# Patient Record
Sex: Female | Born: 1965 | Race: Black or African American | Hispanic: No | Marital: Single | Smoking: Never smoker
Health system: Southern US, Community
[De-identification: ages and names within clinical notes are randomized; demographics above are authoritative.]

## PROBLEM LIST (undated history)

## (undated) HISTORY — PX: ABDOMINAL HYSTERECTOMY: SHX81

---

## 2002-11-29 ENCOUNTER — Other Ambulatory Visit: Admission: RE | Admit: 2002-11-29 | Discharge: 2002-11-29 | Payer: Self-pay | Admitting: Obstetrics and Gynecology

## 2012-05-08 ENCOUNTER — Encounter (HOSPITAL_BASED_OUTPATIENT_CLINIC_OR_DEPARTMENT_OTHER): Payer: Self-pay | Admitting: *Deleted

## 2012-05-08 ENCOUNTER — Emergency Department (HOSPITAL_BASED_OUTPATIENT_CLINIC_OR_DEPARTMENT_OTHER)
Admission: EM | Admit: 2012-05-08 | Discharge: 2012-05-08 | Disposition: A | Discharge status: Home / Self Care | Payer: Self-pay | Attending: Emergency Medicine | Admitting: Emergency Medicine

## 2012-05-08 DIAGNOSIS — T63441A Toxic effect of venom of bees, accidental (unintentional), initial encounter: Secondary | ICD-10-CM

## 2012-05-08 DIAGNOSIS — T63461A Toxic effect of venom of wasps, accidental (unintentional), initial encounter: Secondary | ICD-10-CM | POA: Insufficient documentation

## 2012-05-08 DIAGNOSIS — T6391XA Toxic effect of contact with unspecified venomous animal, accidental (unintentional), initial encounter: Secondary | ICD-10-CM | POA: Insufficient documentation

## 2012-05-08 MED ORDER — PREDNISONE 20 MG PO TABS: 40.0000 mg | ORAL_TABLET | Freq: Every day | ORAL | Status: AC

## 2012-05-08 MED ORDER — PREDNISONE 50 MG PO TABS
60.0000 mg | ORAL_TABLET | Freq: Once | ORAL | Status: AC
  Administered 2012-05-08: 60 mg via ORAL
  Filled 2012-05-08: qty 1

## 2012-05-08 MED ORDER — DIPHENHYDRAMINE HCL 25 MG PO CAPS
50.0000 mg | ORAL_CAPSULE | Freq: Once | ORAL | Status: AC
  Administered 2012-05-08: 50 mg via ORAL
  Filled 2012-05-08: qty 2

## 2012-05-08 NOTE — ED Notes (Signed)
Pt states she ?got stung or bitten on the back of her left thigh last p.m.

## 2012-05-08 NOTE — ED Provider Notes (Addendum)
History  This chart was scribed for Gwyneth Sprout, MD by Shari Heritage. The patient was seen in room MHT13/MHT13. Patient's care was started at 1731.     CSN: [id number]  Arrival date & time 05/08/12  1657   First MD Initiated Contact with Patient 05/08/12 1731      Chief Complaint  Patient presents with  . Insect Bite     Patient is a 46 y.o. female presenting with allergic reaction. The history is provided by the patient. No language interpreter was used.  Allergic Reaction The primary symptoms are  rash. The primary symptoms do not include shortness of breath. The current episode started yesterday. The problem has been gradually worsening. This is a new problem.  The rash began yesterday. The rash appears on the left leg. The pain associated with the rash is moderate. The rash is not associated with itching.  The onset of the reaction was associated with insect bite/sting. Significant symptoms that are not present include itching.    HPI Comments: Katie Pineda is a 46 y.o. female who presents to the Emergency Department complaining of moderate, constant stinging pain and gradually worsening swelling to the left posterior thigh onset yesterday night after she was stung by a bee or a yellow jacket. Patient denies SOB or any other symptoms. Patient hasn't taken any medications for symptom relief at home. Patient has never smoked.  History reviewed. No pertinent past medical history.  Past Surgical History  Procedure Date  . Abdominal hysterectomy     History reviewed. No pertinent family history.  History  Substance Use Topics  . Smoking status: Never Smoker   . Smokeless tobacco: Not on file  . Alcohol Use: No    OB History    Grav Para Term Preterm Abortions TAB SAB Ect Mult Living                  Review of Systems  Constitutional: Negative.   HENT: Negative.   Respiratory: Negative for shortness of breath.   Gastrointestinal: Negative.     Musculoskeletal: Negative.   Skin: Positive for color change and rash. Negative for itching.  Neurological: Negative.   Psychiatric/Behavioral: Negative.     Allergies  Morphine and related  Home Medications  No current outpatient prescriptions on file.  BP 123/82  Pulse 90  Temp 98 F (36.7 C) (Oral)  Resp 20  Ht 5' (1.524 m)  Wt 163 lb (73.936 kg)  BMI 31.83 kg/m2  SpO2 100%  Physical Exam  Constitutional: She is oriented to person, place, and time. She appears well-developed and well-nourished.  HENT:  Head: Normocephalic and atraumatic.  Pulmonary/Chest: Effort normal. No respiratory distress.  Musculoskeletal: Normal range of motion.  Neurological: She is alert and oriented to person, place, and time.  Skin: There is erythema.       Large 7x10 cm area over the left posterior thigh that is erythematous, warm and raised. No fluctuance, induration or pointing.  Psychiatric: She has a normal mood and affect. Her behavior is normal.    ED Course  Procedures (including critical care time) DIAGNOSTIC STUDIES: Oxygen Saturation is 100% on room air, normal by my interpretation.    COORDINATION OF CARE: 5:38pm- Patient informed of current plan for treatment and evaluation and agrees with plan at this time.      Labs Reviewed - No data to display No results found.   1. Allergic reaction to bee sting       MDM  Patient with bee sting yesterday and evidence of local reaction. She denies any shortness of breath, mouth fullness or throat swelling. She is otherwise well-appearing. Patient started on prednisone and Benadryl.     I personally performed the services described in this documentation, which was scribed in my presence.  The recorded information has been reviewed and considered.    Gwyneth Sprout, MD 05/08/12 1745  Gwyneth Sprout, MD 05/08/12 1756

## 2013-11-20 ENCOUNTER — Emergency Department (HOSPITAL_BASED_OUTPATIENT_CLINIC_OR_DEPARTMENT_OTHER)
Admission: EM | Admit: 2013-11-20 | Discharge: 2013-11-20 | Disposition: A | Discharge status: Home / Self Care | Payer: BC Managed Care – PPO | Attending: Emergency Medicine | Admitting: Emergency Medicine

## 2013-11-20 ENCOUNTER — Encounter (HOSPITAL_BASED_OUTPATIENT_CLINIC_OR_DEPARTMENT_OTHER): Payer: Self-pay | Admitting: Emergency Medicine

## 2013-11-20 DIAGNOSIS — R51 Headache: Secondary | ICD-10-CM | POA: Insufficient documentation

## 2013-11-20 DIAGNOSIS — IMO0001 Reserved for inherently not codable concepts without codable children: Secondary | ICD-10-CM | POA: Insufficient documentation

## 2013-11-20 DIAGNOSIS — IMO0002 Reserved for concepts with insufficient information to code with codable children: Secondary | ICD-10-CM | POA: Insufficient documentation

## 2013-11-20 DIAGNOSIS — J020 Streptococcal pharyngitis: Secondary | ICD-10-CM | POA: Insufficient documentation

## 2013-11-20 LAB — RAPID STREP SCREEN (MED CTR MEBANE ONLY): STREPTOCOCCUS, GROUP A SCREEN (DIRECT): POSITIVE — AB

## 2013-11-20 MED ORDER — ACETAMINOPHEN 325 MG PO TABS
650.0000 mg | ORAL_TABLET | Freq: Four times a day (QID) | ORAL | Status: DC | PRN
  Administered 2013-11-20: 650 mg via ORAL
  Filled 2013-11-20: qty 2

## 2013-11-20 MED ORDER — PENICILLIN G BENZATHINE 1200000 UNIT/2ML IM SUSP
1.2000 10*6.[IU] | Freq: Once | INTRAMUSCULAR | Status: AC
  Administered 2013-11-20: 1.2 10*6.[IU] via INTRAMUSCULAR
  Filled 2013-11-20: qty 2

## 2013-11-20 MED ORDER — KETOROLAC TROMETHAMINE 60 MG/2ML IM SOLN
60.0000 mg | Freq: Once | INTRAMUSCULAR | Status: AC
Start: 2013-11-20 — End: 2013-11-20
  Administered 2013-11-20: 60 mg via INTRAMUSCULAR
  Filled 2013-11-20: qty 2

## 2013-11-20 NOTE — ED Notes (Signed)
MD at bedside. 

## 2013-11-20 NOTE — ED Notes (Signed)
Pt d/c home- Ambulatory with steady gait at time of discharge

## 2013-11-20 NOTE — Discharge Instructions (Signed)
Return to the ED with any concerns including difficulty breathing or swallowing, vomiting and not able to keep down liquids, decreased level of alertness/lethargy, or any other alarming symptoms °

## 2013-11-20 NOTE — ED Notes (Signed)
C/o onset of fever, sore throat, headache yesterday.  Denies nausea, vomiting, diarrhea.

## 2013-11-20 NOTE — ED Provider Notes (Signed)
CSN: [id number]     Arrival date & time 11/20/13  1428 History   This chart was scribed for Ethelda ChickMartha K Linker, MD by Beverly MilchJ Harrison Collins, ED Scribe. This patient was seen in room MH07/MH07 and the patient's care was started at 3:14 PM.    Chief Complaint  Patient presents with  . Flu Like Symptoms       Patient is a 48 y.o. female presenting with fever. The history is provided by the patient. No language interpreter was used.  Fever Max temp prior to arrival:  102.9 F Onset quality:  Gradual Duration:  1 day Timing:  Constant Progression:  Worsening Chronicity:  New Relieved by: Theraflu though minimally. Worsened by:  Exertion and movement Ineffective treatments: theraflu with minimal relief. Associated symptoms: chills, headaches, myalgias and sore throat   Associated symptoms: no congestion, no diarrhea, no nausea and no vomiting   Risk factors: no recent travel and no sick contacts   Pt denies taking anything for fever or pain today including tylenol or ibuprofen.   History reviewed. No pertinent past medical history. Past Surgical History  Procedure Laterality Date  . Abdominal hysterectomy      No family history on file. History  Substance Use Topics  . Smoking status: Never Smoker   . Smokeless tobacco: Not on file  . Alcohol Use: No    OB History   Grav Para Term Preterm Abortions TAB SAB Ect Mult Living                  Review of Systems  Constitutional: Positive for fever and chills.  HENT: Positive for sore throat. Negative for congestion.   Gastrointestinal: Negative for nausea, vomiting and diarrhea.  Musculoskeletal: Positive for myalgias.  Neurological: Positive for headaches.  All other systems reviewed and are negative.     Allergies  Morphine and related  Home Medications   Current Outpatient Rx  Name  Route  Sig  Dispense  Refill  . predniSONE (DELTASONE) 20 MG tablet   Oral   Take 2 tablets (40 mg total) by mouth daily.   10  tablet   0    Triage Vitals: BP 114/66  Pulse 107  Temp(Src) 102.9 F (39.4 C) (Oral)  Resp 16  Ht 5\' 1"  (1.549 m)  Wt 175 lb (79.379 kg)  BMI 33.08 kg/m2  SpO2 100%  Physical Exam  Nursing note and vitals reviewed. Constitutional: She is oriented to person, place, and time. She appears well-developed and well-nourished. No distress.  HENT:  Head: Normocephalic and atraumatic.  Right Ear: External ear normal.  Left Ear: External ear normal.  Mouth/Throat: Uvula is midline. Posterior oropharyngeal erythema present. No oropharyngeal exudate.  Palate normal  Eyes: Conjunctivae are normal. Right eye exhibits no discharge. Left eye exhibits no discharge. No scleral icterus.  Neck: Neck supple. No tracheal deviation present.  Cardiovascular: Normal rate, regular rhythm and intact distal pulses.   Pulmonary/Chest: Effort normal and breath sounds normal. No stridor. No respiratory distress. She has no wheezes. She has no rales.  Abdominal: Soft. Bowel sounds are normal. She exhibits no distension. There is no tenderness. There is no rebound and no guarding.  Musculoskeletal: She exhibits no edema and no tenderness.  Lymphadenopathy:    She has no cervical adenopathy.  Neurological: She is alert and oriented to person, place, and time. She has normal strength and normal reflexes. No cranial nerve deficit (no facial droop, extraocular movements intact, no slurred speech) or sensory  deficit. She exhibits normal muscle tone. She displays no seizure activity. Coordination normal.  Skin: Skin is warm and dry. No rash noted.  Psychiatric: She has a normal mood and affect.    ED Course  Procedures (including critical care time)  DIAGNOSTIC STUDIES: Oxygen Saturation is 100% on RA, normal by my interpretation.    COORDINATION OF CARE: 3:19 PM- Discussed strep test and medication to relieve fever and myalgias. Pt advised of plan for treatment and pt agrees.  5:06 PM pt feeling much better  she has been treated with toradol, bicillin.  Vitals are improved.     Labs Review Labs Reviewed  RAPID STREP SCREEN - Abnormal; Notable for the following:    Streptococcus, Group A Screen (Direct) POSITIVE (*)    All other components within normal limits   Imaging Review No results found.   EKG Interpretation None      MDM   Final diagnoses:  Strep pharyngitis    Pt presenting with c/o sore throat, fever, body aches.  She is strep positive, no signs of PTA.  Her vitals are improved after toradol.  She is drinking liquids in the ED.  Discharged with strict return precautions.  Pt agreeable with plan.  I personally performed the services described in this documentation, which was scribed in my presence. The recorded information has been reviewed and is accurate.    Ethelda Chick, MD 11/23/13 [phone]

## 2013-11-20 NOTE — ED Notes (Signed)
Pt Drinking gingerale

## 2013-11-23 ENCOUNTER — Telehealth (HOSPITAL_BASED_OUTPATIENT_CLINIC_OR_DEPARTMENT_OTHER): Payer: Self-pay | Admitting: *Deleted

## 2013-11-23 NOTE — ED Notes (Signed)
rec'd call from pt requesting work note, pt was seen on 4-12. Pt given work note for 4-13 to 4-15 per pt request.

## 2020-03-05 ENCOUNTER — Emergency Department (HOSPITAL_BASED_OUTPATIENT_CLINIC_OR_DEPARTMENT_OTHER)
Admission: EM | Admit: 2020-03-05 | Discharge: 2020-03-05 | Discharge status: Absent without leave | Payer: BC Managed Care – PPO

## 2020-03-05 ENCOUNTER — Encounter (HOSPITAL_BASED_OUTPATIENT_CLINIC_OR_DEPARTMENT_OTHER): Payer: Self-pay | Admitting: *Deleted

## 2020-03-05 ENCOUNTER — Emergency Department (HOSPITAL_BASED_OUTPATIENT_CLINIC_OR_DEPARTMENT_OTHER)
Admission: EM | Admit: 2020-03-05 | Discharge: 2020-03-05 | Disposition: A | Discharge status: Home / Self Care | Payer: No Typology Code available for payment source | Attending: Emergency Medicine | Admitting: Emergency Medicine

## 2020-03-05 ENCOUNTER — Emergency Department (HOSPITAL_BASED_OUTPATIENT_CLINIC_OR_DEPARTMENT_OTHER): Payer: Self-pay

## 2020-03-05 ENCOUNTER — Emergency Department (HOSPITAL_BASED_OUTPATIENT_CLINIC_OR_DEPARTMENT_OTHER): Payer: No Typology Code available for payment source

## 2020-03-05 ENCOUNTER — Other Ambulatory Visit: Payer: Self-pay

## 2020-03-05 DIAGNOSIS — W19XXXA Unspecified fall, initial encounter: Secondary | ICD-10-CM

## 2020-03-05 DIAGNOSIS — Y939 Activity, unspecified: Secondary | ICD-10-CM | POA: Insufficient documentation

## 2020-03-05 DIAGNOSIS — M791 Myalgia, unspecified site: Secondary | ICD-10-CM | POA: Diagnosis not present

## 2020-03-05 DIAGNOSIS — Y999 Unspecified external cause status: Secondary | ICD-10-CM | POA: Insufficient documentation

## 2020-03-05 DIAGNOSIS — Y929 Unspecified place or not applicable: Secondary | ICD-10-CM | POA: Diagnosis not present

## 2020-03-05 DIAGNOSIS — W010XXA Fall on same level from slipping, tripping and stumbling without subsequent striking against object, initial encounter: Secondary | ICD-10-CM | POA: Insufficient documentation

## 2020-03-05 DIAGNOSIS — M25532 Pain in left wrist: Secondary | ICD-10-CM | POA: Insufficient documentation

## 2020-03-05 DIAGNOSIS — M26629 Arthralgia of temporomandibular joint, unspecified side: Secondary | ICD-10-CM | POA: Insufficient documentation

## 2020-03-05 MED ORDER — NAPROXEN 250 MG PO TABS
500.0000 mg | ORAL_TABLET | Freq: Once | ORAL | Status: AC
  Administered 2020-03-05: 500 mg via ORAL
  Filled 2020-03-05: qty 2

## 2020-03-05 MED ORDER — NAPROXEN 500 MG PO TABS: 500.0000 mg | ORAL_TABLET | Freq: Two times a day (BID) | ORAL | 0 refills | Status: AC | PRN

## 2020-03-05 MED FILL — NAPROXEN 500 MG TABS: 500 | 7 days supply | Qty: 15 | Fill #0

## 2020-03-05 NOTE — ED Provider Notes (Signed)
[facility] EMERGENCY DEPARTMENT Provider Note   CSN: [id number] Arrival date & time: 03/05/20  1128     History Chief Complaint  Patient presents with  . Fall  . Wrist Injury    Katie Pineda is a 54 y.o. female with a history of an abdominal hysterectomy who presents to the ED with complaints of L wrist pain S/p mechanical fall earlier today. Patient states she accidentally slipped on a wet floor and fell onto an outstretched left hand. Did bump her head but had no LOC. Reports pain only to L wrist/forearm, constant, severe, worse with movement, no alleviating factors. Denies numbness, tingling, weakness, or other areas of injury. Patient is R hand dominant.   HPI     History reviewed. No pertinent past medical history.  There are no problems to display for this patient.   Past Surgical History:  Procedure Laterality Date  . ABDOMINAL HYSTERECTOMY       OB History   No obstetric history on file.     History reviewed. No pertinent family history.  Social History   Tobacco Use  . Smoking status: Never Smoker  . Smokeless tobacco: Never Used  Substance Use Topics  . Alcohol use: No  . Drug use: No    Home Medications Prior to Admission medications   Medication Sig Start Date End Date Taking? Authorizing Provider  predniSONE (DELTASONE) 20 MG tablet Take 2 tablets (40 mg total) by mouth daily. 05/08/12   Gwyneth Sprout, MD    Allergies    Morphine and related  Review of Systems   Review of Systems  Constitutional: Negative for chills and fever.  Eyes: Negative for visual disturbance.  Respiratory: Negative for shortness of breath.   Cardiovascular: Negative for chest pain.  Gastrointestinal: Negative for vomiting.  Musculoskeletal: Positive for arthralgias and myalgias. Negative for back pain and neck pain.  Neurological: Negative for dizziness, seizures, syncope, speech difficulty, weakness, light-headedness, numbness and headaches.     Physical Exam Updated Vital Signs BP (!) 144/95 (BP Location: Right Arm)   Pulse 77   Temp 98.5 F (36.9 C) (Oral)   Resp 16   Ht 5\' 1"  (1.549 m)   Wt 74.8 kg   SpO2 99%   BMI 31.18 kg/m   Physical Exam Vitals and nursing note reviewed.  Constitutional:      General: She is not in acute distress.    Appearance: Normal appearance. She is not ill-appearing or toxic-appearing.  HENT:     Head: Normocephalic and atraumatic.     Comments: No racoon eyes or battle sign.  Eyes:     Pupils: Pupils are equal, round, and reactive to light.  Neck:     Comments: No midline tenderness.  Cardiovascular:     Rate and Rhythm: Normal rate and regular rhythm.     Pulses:          Radial pulses are 2+ on the right side and 2+ on the left side.  Pulmonary:     Effort: Pulmonary effort is normal. No respiratory distress.     Breath sounds: Normal breath sounds.  Chest:     Chest wall: No tenderness.  Abdominal:     Tenderness: There is no abdominal tenderness. There is no guarding or rebound.  Musculoskeletal:     Cervical back: Normal range of motion and neck supple. No tenderness.     Comments: Upper extremities: No obvious deformity, appreciable swelling, edema, erythema, ecchymosis, warmth, or open wounds. Patient  has intact AROM throughout with the exception of limitation at the L wrist with flexion/extesnion- able to move in each direction some as well as limitation with LUE pronation- able to do so some. Tender to the dorsal aspect of the wrist most prominently, extends to diffuse forearm, mild tenderness to the L radial head. Otherwise nontender. No anatomical snuffbox tenderness.  Back: No midline tenderness.   Skin:    General: Skin is warm and dry.     Capillary Refill: Capillary refill takes less than 2 seconds.  Neurological:     Mental Status: She is alert.     Comments: Alert. Clear speech. Sensation grossly intact to bilateral upper extremities. 5/5 symmetric grip  strength. Ambulatory.   Psychiatric:        Mood and Affect: Mood normal.        Behavior: Behavior normal.     ED Results / Procedures / Treatments   Labs (all labs ordered are listed, but only abnormal results are displayed) Labs Reviewed - No data to display  EKG None  Radiology DG Wrist Complete Left  Result Date: 03/05/2020 CLINICAL DATA:  Pain following fall EXAM: LEFT WRIST - COMPLETE 3+ VIEW COMPARISON:  None. FINDINGS: Frontal, oblique, lateral, and ulnar deviation scaphoid images were obtained. No fracture or dislocation. Joint spaces appear normal. No erosive change. IMPRESSION: No fracture or dislocation.  No evident arthropathy. Electronically Signed   By: Bretta Bang III M.D.   On: 03/05/2020 12:04    Procedures Procedures (including critical care time)  Medications Ordered in ED Medications  naproxen (NAPROSYN) tablet 500 mg (has no administration in time range)    ED Course  I have reviewed the triage vital signs and the nursing notes.  Pertinent labs & imaging results that were available during my care of the patient were reviewed by me and considered in my medical decision making (see chart for details).    MDM Rules/Calculators/A&P                          Patient presents to the ED S/p mechanical fall with LUE Pain.  No obvious significant head, neck, back, or intra-thoracic/abdominal injury. No focal neuro deficits, no midline spinal tenderness, no chest/abdominal tenderness. Per [place] CT rules do not feel CT head imaging is necessary at this time. L wrist x-ray ordered by triage team has been personally reviewed, no fractures/dislocation present.  I recommended further LUE imaging to specifically evaluate the radial head, however patient did not want to stay in the ED for this, she had to leave, suspicion for fx is somewhat low given primary tenderness is over the wrist. She is NVI distally. Will place in wrist brace with sling & sports medicine  follow up. I discussed results, treatment plan, need for follow-up, and return precautions with the patient. Provided opportunity for questions, patient confirmed understanding and is in agreement with plan.   Final Clinical Impression(s) / ED Diagnoses Final diagnoses:  Fall, initial encounter  Left wrist pain    Rx / DC Orders ED Discharge Orders         Ordered    naproxen (NAPROSYN) 500 MG tablet  2 times daily PRN     Discontinue  Reprint     03/05/20 1444           Zaylia Riolo, [place] R, PA-C 03/05/20 1446    Little, Ambrose Finland, MD 03/08/20 [phone]

## 2020-03-05 NOTE — Discharge Instructions (Addendum)
Please read and follow all provided instructions.  You have been seen today for a fall with a left wrist injury.   Tests performed today include: An x-ray of the left wrist- does NOT show any broken bones or dislocations. We discussed further imaging which you were unfortunately unable to stay for, this is always a chance a fracture was missed by not doing further imaging or potentially missed on your x-ray due to it being an occult type of fracture. For this reason we would like you to follow up with sports medicine. Vital signs. See below for your results today.   Home care instructions: -- *PRICE in the first 24-48 hours after injury: Protect (with brace, splint, sling), if given by your provider Rest Ice- Do not apply ice pack directly to your skin, place towel or similar between your skin and ice/ice pack. Apply ice for 20 min, then remove for 40 min while awake Compression- Wear brace, elastic bandage, splint as directed by your provider Elevate affected extremity above the level of your heart when not walking around for the first 24-48 hours   Medications:  - Naproxen is a nonsteroidal anti-inflammatory medication that will help with pain and swelling. Be sure to take this medication as prescribed with food, 1 pill every 12 hours,  It should be taken with food, as it can cause stomach upset, and more seriously, stomach bleeding. Do not take other nonsteroidal anti-inflammatory medications with this such as Advil, Motrin, Aleve, Mobic, Goodie Powder, or Motrin.    You make take Tylenol per over the counter dosing with these medications.   We have prescribed you new medication(s) today. Discuss the medications prescribed today with your pharmacist as they can have adverse effects and interactions with your other medicines including over the counter and prescribed medications. Seek medical evaluation if you start to experience new or abnormal symptoms after taking one of these medicines, seek  care immediately if you start to experience difficulty breathing, feeling of your throat closing, facial swelling, or rash as these could be indications of a more serious allergic reaction   Follow-up instructions: Please follow-up with the sports medicine doctor in your discharge instructions if you continue to have significant pain in 1 week. In this case you may have a more severe injury that requires further care.   Return instructions:  Please return if your digits or extremity are numb or tingling, appear gray or blue, or you have severe pain (also elevate the extremity and loosen splint or wrap if you were given one) Please return if you have redness or fevers.  Please return to the Emergency Department if you experience worsening symptoms.  Please return if you have any other emergent concerns. Additional Information:  Your vital signs today were: BP (!) 144/95 (BP Location: Right Arm)   Pulse 77   Temp 98.5 F (36.9 C) (Oral)   Resp 16   Ht 5\' 1"  (1.549 m)   Wt 74.8 kg   SpO2 99%   BMI 31.18 kg/m  If your blood pressure (BP) was elevated above 135/85 this visit, please have this repeated by your doctor within one month. ---------------

## 2020-03-05 NOTE — ED Triage Notes (Signed)
She slipped in water at work today with injury to her left wrist. Pain in her arm.

## 2020-03-22 ENCOUNTER — Ambulatory Visit: Payer: Self-pay | Admitting: Family Medicine

## 2022-07-11 IMAGING — DX DG WRIST COMPLETE 3+V*L*
4 series · 4 of 4 positions shown · non-contrast
Comparison: None.

CLINICAL DATA: Pain following fall

EXAM:
LEFT WRIST - COMPLETE 3+ VIEW

[wrist pa]
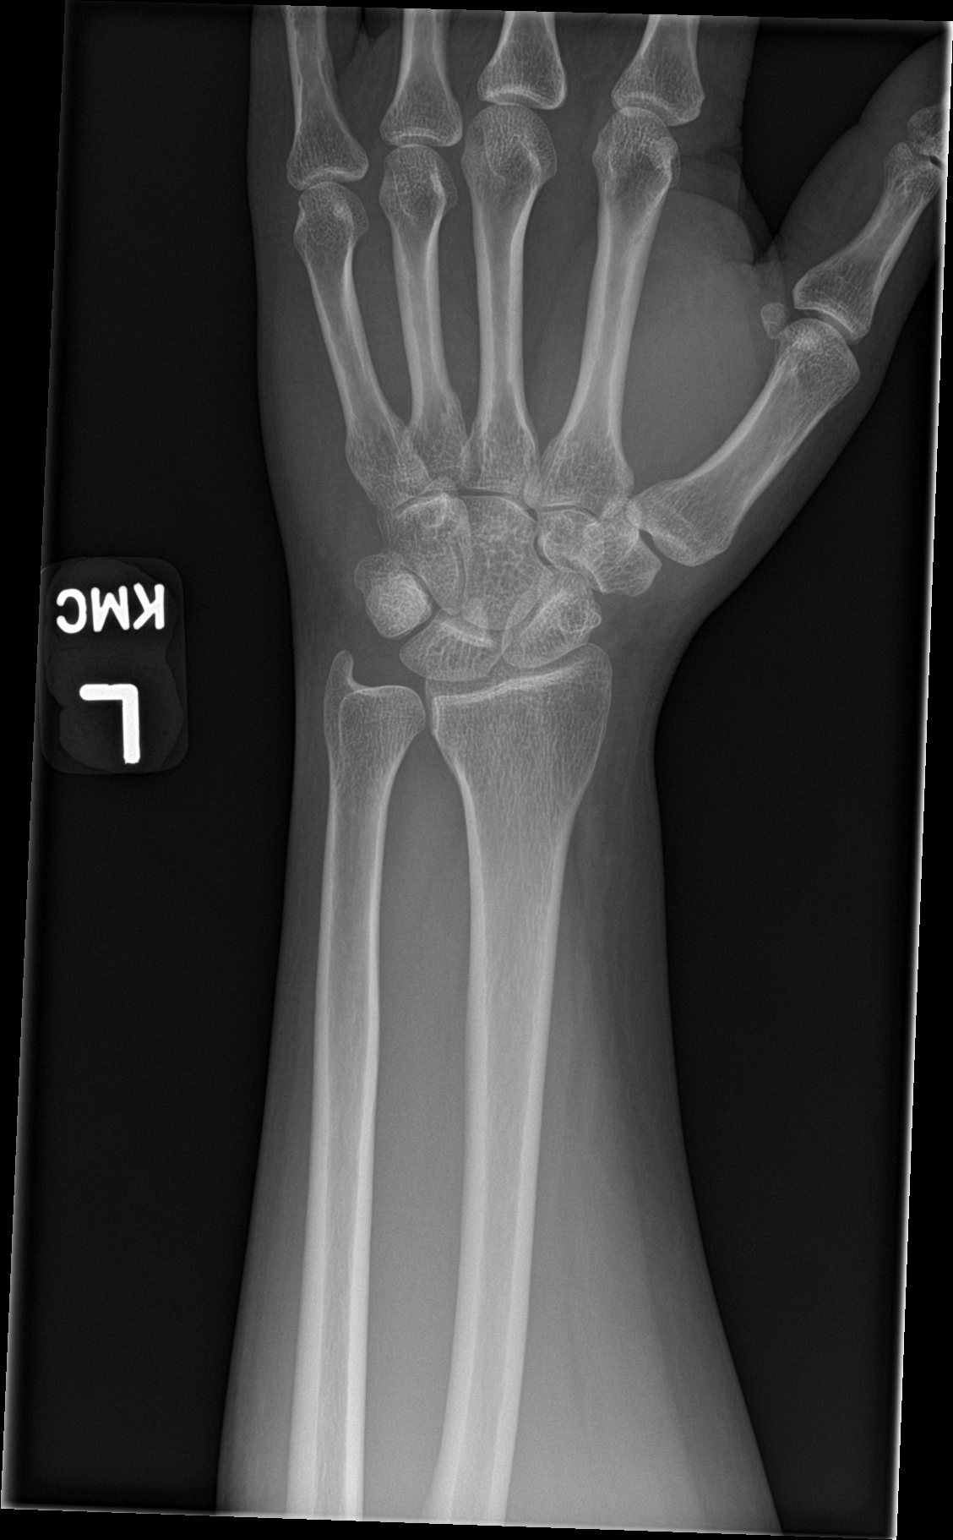

[wrist obl]
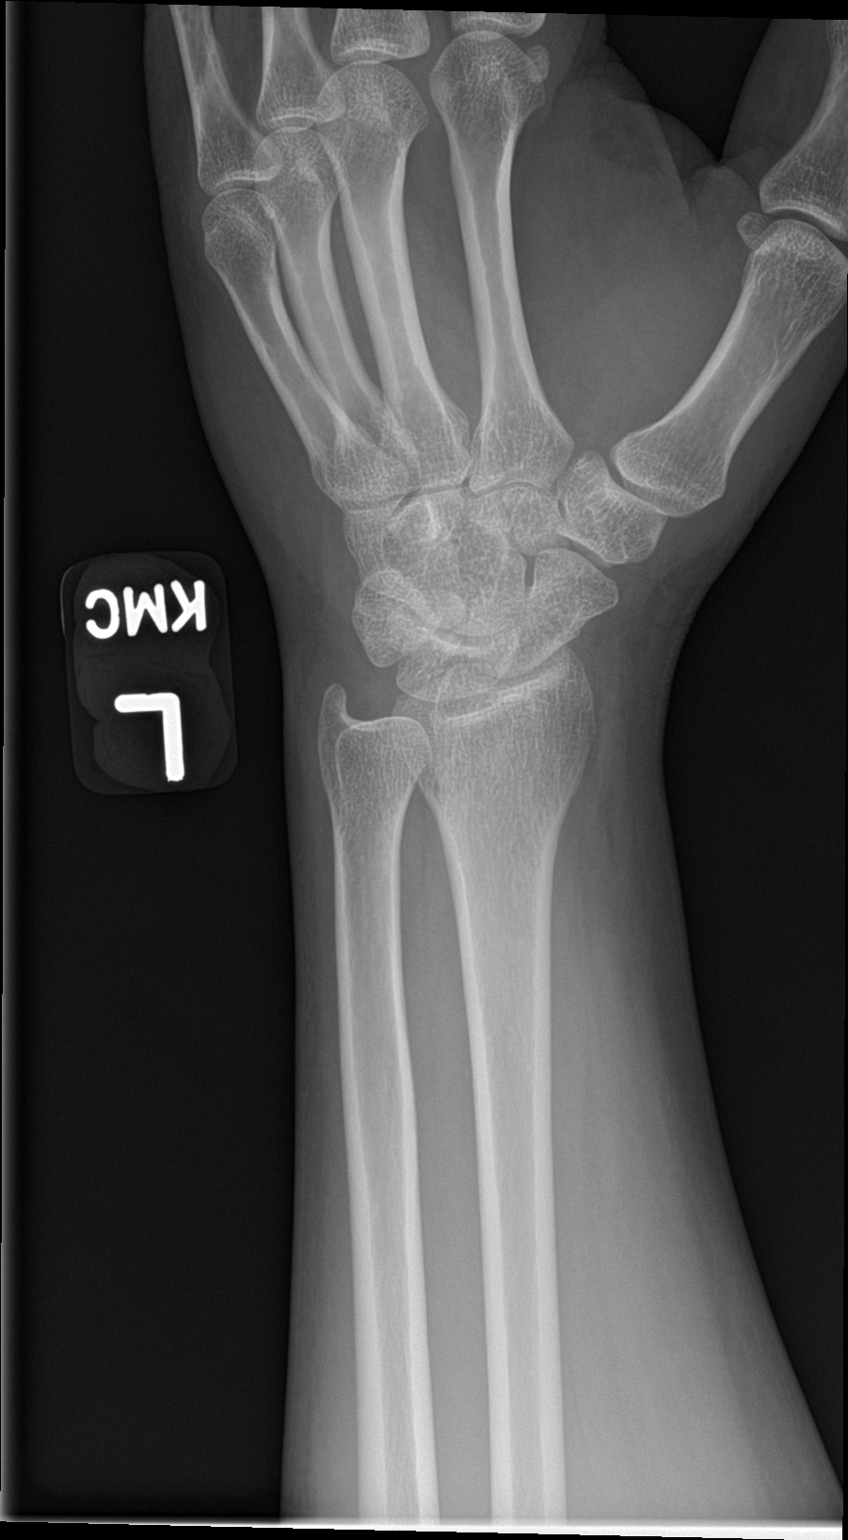

[wrist lat]
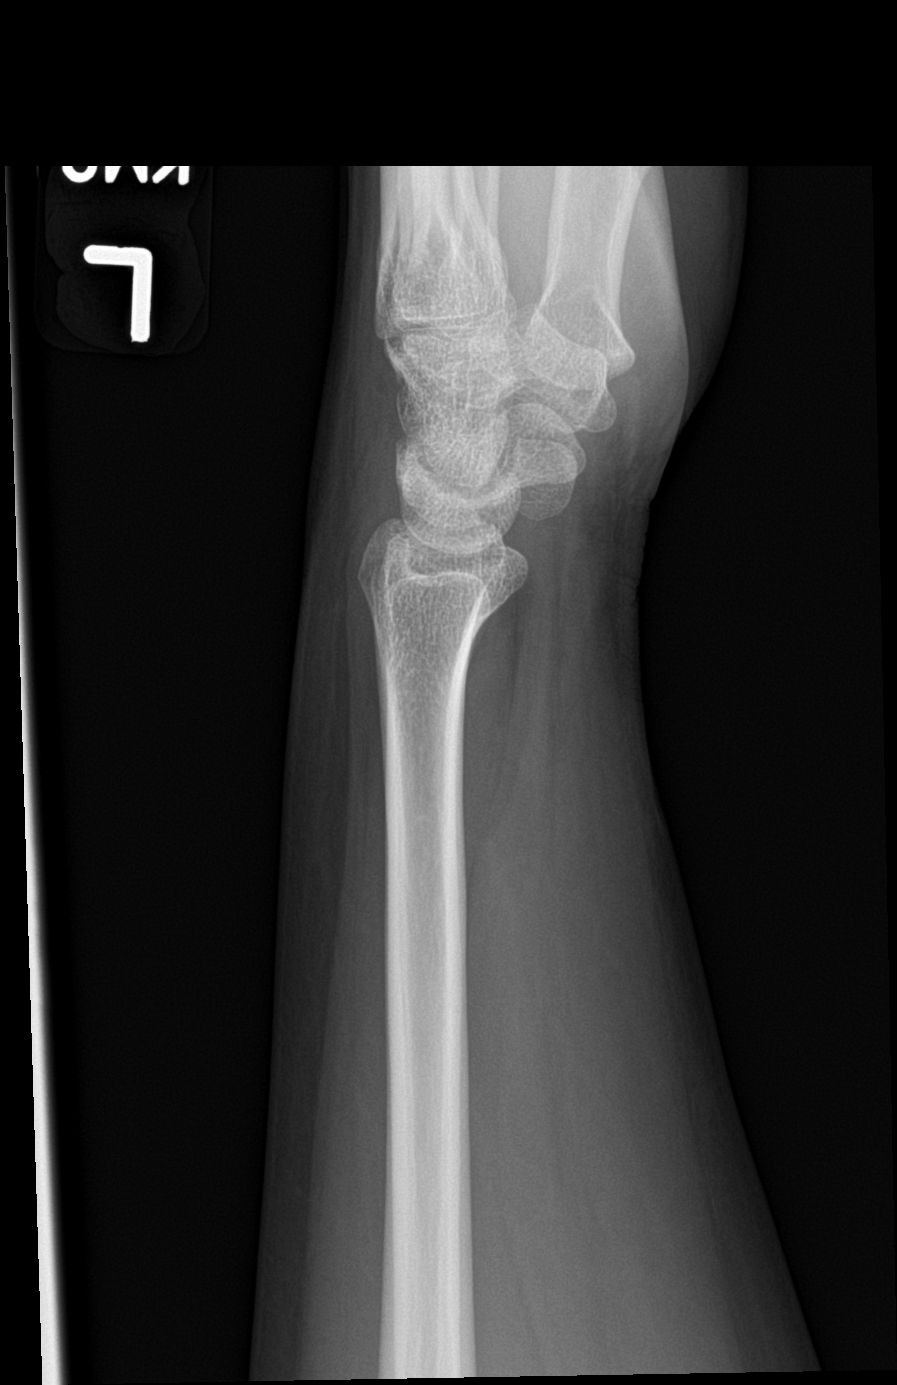

[wrist navicular]
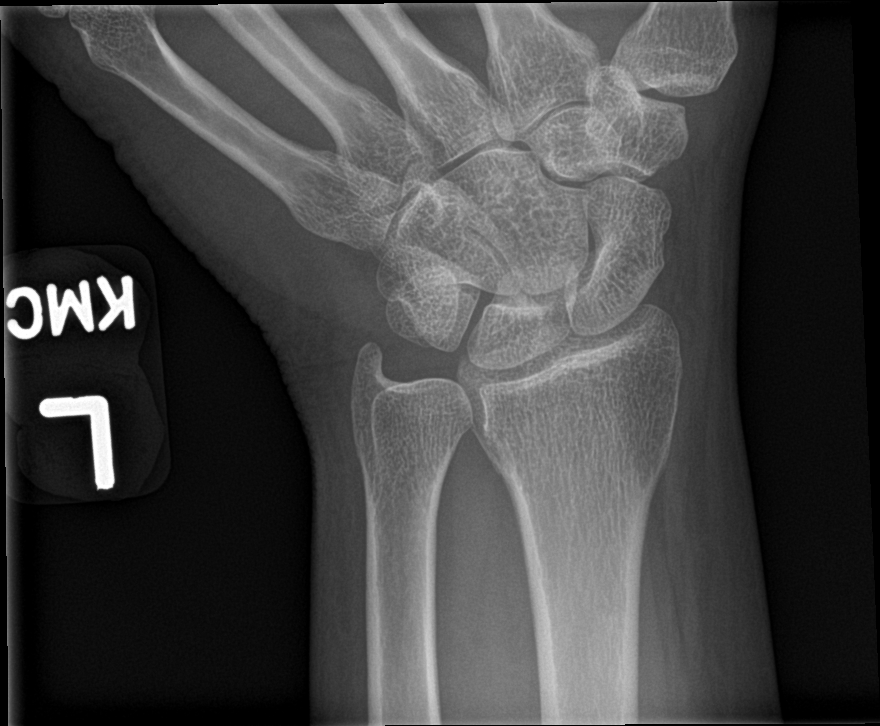

[4 of 4 positions shown; findings below may reference images not displayed]

FINDINGS: Frontal, oblique, lateral, and ulnar deviation scaphoid images were
obtained. No fracture or dislocation. Joint spaces appear normal. No
erosive change.
IMPRESSION: No fracture or dislocation.  No evident arthropathy.

## 2022-11-25 ENCOUNTER — Encounter: Payer: Self-pay | Admitting: *Deleted
